# Patient Record
Sex: Male | Born: 1955 | Race: Black or African American | Hispanic: No | Marital: Married | Smoking: Former smoker
Health system: Southern US, Community
[De-identification: ages and names within clinical notes are randomized; demographics above are authoritative.]

## PROBLEM LIST (undated history)

## (undated) DIAGNOSIS — F439 Reaction to severe stress, unspecified: Secondary | ICD-10-CM

## (undated) DIAGNOSIS — E785 Hyperlipidemia, unspecified: Secondary | ICD-10-CM

## (undated) DIAGNOSIS — E559 Vitamin D deficiency, unspecified: Secondary | ICD-10-CM

## (undated) DIAGNOSIS — E669 Obesity, unspecified: Secondary | ICD-10-CM

## (undated) DIAGNOSIS — R319 Hematuria, unspecified: Secondary | ICD-10-CM

## (undated) DIAGNOSIS — M1990 Unspecified osteoarthritis, unspecified site: Secondary | ICD-10-CM

## (undated) DIAGNOSIS — K0530 Chronic periodontitis, unspecified: Secondary | ICD-10-CM

## (undated) DIAGNOSIS — I10 Essential (primary) hypertension: Secondary | ICD-10-CM

## (undated) DIAGNOSIS — N529 Male erectile dysfunction, unspecified: Secondary | ICD-10-CM

## (undated) HISTORY — DX: Reaction to severe stress, unspecified: F43.9

## (undated) HISTORY — DX: Chronic periodontitis, unspecified: K05.30

## (undated) HISTORY — DX: Vitamin D deficiency, unspecified: E55.9

## (undated) HISTORY — DX: Hyperlipidemia, unspecified: E78.5

## (undated) HISTORY — DX: Obesity, unspecified: E66.9

## (undated) HISTORY — PX: TONSILLECTOMY: SUR1361

## (undated) HISTORY — DX: Essential (primary) hypertension: I10

## (undated) HISTORY — DX: Male erectile dysfunction, unspecified: N52.9

---

## 2000-05-16 ENCOUNTER — Emergency Department (HOSPITAL_COMMUNITY): Admission: EM | Admit: 2000-05-16 | Discharge: 2000-05-16 | Payer: Self-pay | Admitting: Emergency Medicine

## 2000-08-03 ENCOUNTER — Encounter: Payer: Self-pay | Admitting: Emergency Medicine

## 2000-08-03 ENCOUNTER — Emergency Department (HOSPITAL_COMMUNITY): Admission: EM | Admit: 2000-08-03 | Discharge: 2000-08-03 | Payer: Self-pay | Admitting: Emergency Medicine

## 2002-10-10 ENCOUNTER — Encounter: Payer: Self-pay | Admitting: Internal Medicine

## 2002-10-10 ENCOUNTER — Encounter: Admission: RE | Admit: 2002-10-10 | Discharge: 2002-10-10 | Payer: Self-pay | Admitting: Internal Medicine

## 2005-01-13 ENCOUNTER — Encounter: Admission: RE | Admit: 2005-01-13 | Discharge: 2005-01-13 | Payer: Self-pay | Admitting: Internal Medicine

## 2005-12-17 ENCOUNTER — Ambulatory Visit (HOSPITAL_COMMUNITY): Admission: RE | Admit: 2005-12-17 | Discharge: 2005-12-17 | Payer: Self-pay | Admitting: Cardiology

## 2008-12-19 ENCOUNTER — Encounter: Admission: RE | Admit: 2008-12-19 | Discharge: 2008-12-19 | Payer: Self-pay | Admitting: Internal Medicine

## 2009-06-14 ENCOUNTER — Emergency Department (HOSPITAL_COMMUNITY): Admission: EM | Admit: 2009-06-14 | Discharge: 2009-06-14 | Payer: Self-pay | Admitting: Emergency Medicine

## 2012-11-18 ENCOUNTER — Encounter: Payer: Self-pay | Admitting: *Deleted

## 2012-11-18 DIAGNOSIS — I10 Essential (primary) hypertension: Secondary | ICD-10-CM | POA: Insufficient documentation

## 2012-11-18 DIAGNOSIS — F439 Reaction to severe stress, unspecified: Secondary | ICD-10-CM | POA: Insufficient documentation

## 2012-11-18 DIAGNOSIS — N529 Male erectile dysfunction, unspecified: Secondary | ICD-10-CM | POA: Insufficient documentation

## 2012-11-18 DIAGNOSIS — E559 Vitamin D deficiency, unspecified: Secondary | ICD-10-CM | POA: Insufficient documentation

## 2012-11-18 DIAGNOSIS — E669 Obesity, unspecified: Secondary | ICD-10-CM | POA: Insufficient documentation

## 2012-11-18 DIAGNOSIS — E785 Hyperlipidemia, unspecified: Secondary | ICD-10-CM | POA: Insufficient documentation

## 2017-04-14 ENCOUNTER — Ambulatory Visit: Payer: Self-pay

## 2017-04-14 ENCOUNTER — Other Ambulatory Visit: Payer: Self-pay | Admitting: Occupational Medicine

## 2017-04-14 DIAGNOSIS — M25562 Pain in left knee: Secondary | ICD-10-CM

## 2017-04-17 ENCOUNTER — Encounter (HOSPITAL_COMMUNITY): Payer: Self-pay | Admitting: Nurse Practitioner

## 2017-04-17 ENCOUNTER — Emergency Department (HOSPITAL_COMMUNITY)
Admission: EM | Admit: 2017-04-17 | Discharge: 2017-04-17 | Disposition: A | Discharge status: Home / Self Care | Payer: Self-pay | Attending: Emergency Medicine | Admitting: Emergency Medicine

## 2017-04-17 DIAGNOSIS — Z7982 Long term (current) use of aspirin: Secondary | ICD-10-CM | POA: Insufficient documentation

## 2017-04-17 DIAGNOSIS — Y999 Unspecified external cause status: Secondary | ICD-10-CM | POA: Insufficient documentation

## 2017-04-17 DIAGNOSIS — S8992XA Unspecified injury of left lower leg, initial encounter: Secondary | ICD-10-CM | POA: Insufficient documentation

## 2017-04-17 DIAGNOSIS — X509XXA Other and unspecified overexertion or strenuous movements or postures, initial encounter: Secondary | ICD-10-CM | POA: Insufficient documentation

## 2017-04-17 DIAGNOSIS — Z79899 Other long term (current) drug therapy: Secondary | ICD-10-CM | POA: Insufficient documentation

## 2017-04-17 DIAGNOSIS — I10 Essential (primary) hypertension: Secondary | ICD-10-CM | POA: Insufficient documentation

## 2017-04-17 DIAGNOSIS — Z87891 Personal history of nicotine dependence: Secondary | ICD-10-CM | POA: Insufficient documentation

## 2017-04-17 DIAGNOSIS — Y9301 Activity, walking, marching and hiking: Secondary | ICD-10-CM | POA: Insufficient documentation

## 2017-04-17 DIAGNOSIS — Y92018 Other place in single-family (private) house as the place of occurrence of the external cause: Secondary | ICD-10-CM | POA: Insufficient documentation

## 2017-04-17 MED ORDER — HYDROCODONE-ACETAMINOPHEN 5-325 MG PO TABS: 1.0000 | ORAL_TABLET | Freq: Four times a day (QID) | ORAL | 0 refills | Status: DC | PRN

## 2017-04-17 MED ORDER — IBUPROFEN 600 MG PO TABS: 600.0000 mg | ORAL_TABLET | Freq: Four times a day (QID) | ORAL | 0 refills | Status: DC | PRN

## 2017-04-17 MED ORDER — HYDROCODONE-ACETAMINOPHEN 5-325 MG PO TABS
1.0000 | ORAL_TABLET | Freq: Once | ORAL | Status: AC
  Administered 2017-04-17: 1 via ORAL
  Filled 2017-04-17: qty 1

## 2017-04-17 NOTE — ED Provider Notes (Signed)
[facility]-EMERGENCY DEPT Provider Note   CSN: [id number] Arrival date & time: 04/17/17  1914     History   Chief Complaint Chief Complaint  Patient presents with  . Knee Pain    Left    HPI Daniel Perez is a 61 y.o. male.  HPI   Daniel Perez is a 61 y.o. male, with a history of HTN, presenting to the ED with Left knee pain beginning 5 days ago. 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 Patient was seen at occupational health on June 12, x-rays were clear, was given a knee sleeve, and discharge with instructions to take ibuprofen or naproxen and follow-up in 2-3 weeks. Earlier today, patient stepped off his front porch, felt and heard a pop in the left knee, followed by 10 out of 10 pain. Pain is worse with weightbearing. He states he has not previously injured this knee before. He has taken Aleve without improvement. Denies neuro deficits, falls, or any other complaints.    Past Medical History:  Diagnosis Date  . Erectile dysfunction   . Hyperlipidemia   . Hypertension   . Mild obesity   . Periodontitis   . Situational stress    transient  . Vitamin D deficiency     Patient Active Problem List   Diagnosis Date Noted  . Hypertension   . Hyperlipidemia   . Mild obesity   . Erectile dysfunction   . Situational stress   . Vitamin D deficiency     Past Surgical History:  Procedure Laterality Date  . TONSILLECTOMY     1992       Home Medications    Prior to Admission medications   Medication Sig Start Date End Date Taking? Authorizing Provider  aspirin 81 MG tablet Take 81 mg by mouth daily.    [provider]  atorvastatin (LIPITOR) 20 MG tablet Take 20 mg by mouth daily.    [provider]  doxazosin (CARDURA) 4 MG tablet Take 4 mg by mouth at bedtime.    [provider]  HYDROcodone-acetaminophen (NORCO/VICODIN) 5-325 MG tablet Take 1-2 tablets by mouth every 6 (six) hours as  needed for severe pain. 04/17/17   Joy, Shawn C, PA-C  ibuprofen (ADVIL,MOTRIN) 600 MG tablet Take 1 tablet (600 mg total) by mouth every 6 (six) hours as needed. 04/17/17   Joy, Shawn C, PA-C  Multiple Vitamins-Minerals (MULTIVITAMIN PO) Take by mouth daily.    [provider]  triamterene-hydrochlorothiazide (DYAZIDE) 50-25 MG per capsule Take 1 capsule by mouth daily.    [provider]  VITAMIN D, CHOLECALCIFEROL, PO Take 1 tablet by mouth daily.    [provider]    Family History Family History  Problem Relation Age of Onset  . Stroke Mother   . Lung cancer Father   . Cirrhosis Sister   . Pneumonia Sister     Social History Social History  Substance Use Topics  . Smoking status: Former Games developer  . Smokeless tobacco: Never Used  . Alcohol use No     Allergies   Patient has no known allergies.   Review of Systems Review of Systems  Musculoskeletal: Positive for arthralgias and joint swelling.  Neurological: Negative for weakness and numbness.     Physical Exam Updated Vital Signs BP (!) 138/93 (BP Location: Left Arm)   Pulse 74   Temp 98.3 F (36.8 C) (Oral)   Resp 18  Ht 5\' 10"  (1.778 m)   Wt 95.8 kg (211 lb 3.2 oz)   SpO2 99%   BMI 30.30 kg/m   Physical Exam  Constitutional: He appears well-developed and well-nourished. No distress.  HENT:  Head: Normocephalic and atraumatic.  Eyes: Conjunctivae are normal.  Neck: Neck supple.  Cardiovascular: Normal rate, regular rhythm and intact distal pulses.   Pulmonary/Chest: Effort normal.  Musculoskeletal: He exhibits edema and tenderness.  Tenderness to the lateral left knee with some associated swelling. No noted deformity, erythema, or crepitus. Pain intensifies over the LCL with weightbearing, full extension, and varus stress. Range of motion intact. No noted laxity.  Neurological: He is alert.  No sensory deficits noted. Strength 5 out of 5 in the bilateral lower extremities.    Skin: Skin is warm and dry. He is not diaphoretic.  Psychiatric: He has a normal mood and affect. His behavior is normal.  Nursing note and vitals reviewed.    ED Treatments / Results  Labs (all labs ordered are listed, but only abnormal results are displayed) Labs Reviewed - No data to display  EKG  EKG Interpretation None       Radiology No results found.  Procedures Procedures (including critical care time)  Medications Ordered in ED Medications  HYDROcodone-acetaminophen (NORCO/VICODIN) 5-325 MG per tablet 1 tablet (1 tablet Oral Given 04/17/17 2134)     Initial Impression / Assessment and Plan / ED Course  I have reviewed the triage vital signs and the nursing notes.  Pertinent labs & imaging results that were available during my care of the patient were reviewed by me and considered in my medical decision making (see chart for details).      Patient presents with a left knee injury. Possible LCL injury without suspected complete tear. Crutches and knee immobilizer. Orthopedic follow-up.     Final Clinical Impressions(s) / ED Diagnoses   Final diagnoses:  Injury of left knee, initial encounter    New Prescriptions Discharge Medication List as of 04/17/2017  9:34 PM    START taking these medications   Details  HYDROcodone-acetaminophen (NORCO/VICODIN) 5-325 MG tablet Take 1-2 tablets by mouth every 6 (six) hours as needed for severe pain., Starting Fri 04/17/2017, Print    ibuprofen (ADVIL,MOTRIN) 600 MG tablet Take 1 tablet (600 mg total) by mouth every 6 (six) hours as needed., Starting Fri 04/17/2017, Print         Joy, ClintShawn C, PA-C 04/18/17 0112    Raeford RazorKohut, Stephen, MD 04/20/17 [id number]

## 2017-04-17 NOTE — Discharge Instructions (Signed)
You have been seen today for a knee injury. A partial ligament tear is possible. This could be from your injury sustained earlier in the week. Pain: Take 600 mg of ibuprofen every 6 hours or 440 mg (over the counter dose) to 500 mg (prescription dose) of naproxen every 12 hours or for the next 3 days. After this time, these medications may be used as needed for pain. Take these medications with food to avoid upset stomach. Choose only one of these medications, do not take them together.  Vicodin for severe pain. Do not drive or perform other dangerous activities while taking the Vicodin. Ice: May apply ice to the area over the next 24 hours for 15 minutes at a time to reduce swelling. Elevation: Keep the extremity elevated as often as possible to reduce pain and inflammation. Support: Wear the knee immmobilizer for support and comfort. Wear this until pain resolves.  Follow up: Follow up with orthopedics on this issue. Call the number provided to set up an appointment.

## 2017-04-17 NOTE — ED Triage Notes (Signed)
Pt c/o left knee pain stating she heard a pop knee sound accompanied the severe pain. Notes mild swelling.

## 2017-04-17 NOTE — ED Notes (Signed)
Pt had [facility] to [facility] and got an x-ray performed, and was given a brace and told to take aleve. Pt went down off a step today and heard a "pop." Pt c/o 8/10 pain when bearing weight. Pt has extension and flexion without pain. Ambulatory.

## 2018-07-15 ENCOUNTER — Inpatient Hospital Stay (HOSPITAL_COMMUNITY)
Admission: EM | Admit: 2018-07-15 | Discharge: 2018-07-17 | DRG: 872 | Disposition: A | Discharge status: Home / Self Care | Payer: 59 | Attending: Family Medicine | Admitting: Family Medicine

## 2018-07-15 ENCOUNTER — Emergency Department (HOSPITAL_COMMUNITY): Payer: 59

## 2018-07-15 ENCOUNTER — Encounter (HOSPITAL_COMMUNITY): Payer: Self-pay

## 2018-07-15 ENCOUNTER — Other Ambulatory Visit: Payer: Self-pay

## 2018-07-15 DIAGNOSIS — N400 Enlarged prostate without lower urinary tract symptoms: Secondary | ICD-10-CM | POA: Diagnosis present

## 2018-07-15 DIAGNOSIS — E559 Vitamin D deficiency, unspecified: Secondary | ICD-10-CM | POA: Diagnosis present

## 2018-07-15 DIAGNOSIS — N3001 Acute cystitis with hematuria: Secondary | ICD-10-CM | POA: Diagnosis present

## 2018-07-15 DIAGNOSIS — Z87891 Personal history of nicotine dependence: Secondary | ICD-10-CM

## 2018-07-15 DIAGNOSIS — Z7982 Long term (current) use of aspirin: Secondary | ICD-10-CM | POA: Diagnosis not present

## 2018-07-15 DIAGNOSIS — E669 Obesity, unspecified: Secondary | ICD-10-CM | POA: Diagnosis present

## 2018-07-15 DIAGNOSIS — Z6833 Body mass index (BMI) 33.0-33.9, adult: Secondary | ICD-10-CM | POA: Diagnosis not present

## 2018-07-15 DIAGNOSIS — Z801 Family history of malignant neoplasm of trachea, bronchus and lung: Secondary | ICD-10-CM | POA: Diagnosis not present

## 2018-07-15 DIAGNOSIS — E785 Hyperlipidemia, unspecified: Secondary | ICD-10-CM | POA: Diagnosis present

## 2018-07-15 DIAGNOSIS — R972 Elevated prostate specific antigen [PSA]: Secondary | ICD-10-CM | POA: Diagnosis present

## 2018-07-15 DIAGNOSIS — B9620 Unspecified Escherichia coli [E. coli] as the cause of diseases classified elsewhere: Secondary | ICD-10-CM | POA: Diagnosis present

## 2018-07-15 DIAGNOSIS — N529 Male erectile dysfunction, unspecified: Secondary | ICD-10-CM | POA: Diagnosis present

## 2018-07-15 DIAGNOSIS — A4151 Sepsis due to Escherichia coli [E. coli]: Secondary | ICD-10-CM | POA: Diagnosis not present

## 2018-07-15 DIAGNOSIS — A419 Sepsis, unspecified organism: Secondary | ICD-10-CM | POA: Diagnosis present

## 2018-07-15 DIAGNOSIS — N179 Acute kidney failure, unspecified: Secondary | ICD-10-CM | POA: Diagnosis present

## 2018-07-15 DIAGNOSIS — I10 Essential (primary) hypertension: Secondary | ICD-10-CM

## 2018-07-15 DIAGNOSIS — Z79899 Other long term (current) drug therapy: Secondary | ICD-10-CM

## 2018-07-15 DIAGNOSIS — R319 Hematuria, unspecified: Secondary | ICD-10-CM

## 2018-07-15 HISTORY — DX: Hematuria, unspecified: R31.9

## 2018-07-15 HISTORY — DX: Unspecified osteoarthritis, unspecified site: M19.90

## 2018-07-15 LAB — URINALYSIS, ROUTINE W REFLEX MICROSCOPIC
BILIRUBIN URINE: NEGATIVE
Glucose, UA: NEGATIVE mg/dL
KETONES UR: 5 mg/dL — AB
Nitrite: NEGATIVE
PROTEIN: 30 mg/dL — AB
Specific Gravity, Urine: 1.012 (ref 1.005–1.030)
pH: 6 (ref 5.0–8.0)

## 2018-07-15 LAB — BASIC METABOLIC PANEL
Anion gap: 11 (ref 5–15)
BUN: 10 mg/dL (ref 8–23)
CO2: 27 mmol/L (ref 22–32)
CREATININE: 1.38 mg/dL — AB (ref 0.61–1.24)
Calcium: 9.4 mg/dL (ref 8.9–10.3)
Chloride: 98 mmol/L (ref 98–111)
GFR calc Af Amer: >60 mL/min (ref >60)
GFR, EST NON AFRICAN AMERICAN: 54 mL/min — AB (ref >60)
Glucose, Bld: 112 mg/dL — ABNORMAL HIGH (ref 70–99)
Potassium: 3.5 mmol/L (ref 3.5–5.1)
SODIUM: 136 mmol/L (ref 135–145)

## 2018-07-15 LAB — HEPATIC FUNCTION PANEL
ALBUMIN: 3.8 g/dL (ref 3.5–5.0)
ALK PHOS: 58 U/L (ref 38–126)
ALT: 28 U/L (ref 0–44)
AST: 24 U/L (ref 15–41)
BILIRUBIN DIRECT: 0.3 mg/dL — AB (ref 0.0–0.2)
Indirect Bilirubin: 1.2 mg/dL — ABNORMAL HIGH (ref 0.3–0.9)
Total Bilirubin: 1.5 mg/dL — ABNORMAL HIGH (ref 0.3–1.2)
Total Protein: 8.1 g/dL (ref 6.5–8.1)

## 2018-07-15 LAB — CBC
HCT: 45.9 % (ref 39.0–52.0)
Hemoglobin: 15.1 g/dL (ref 13.0–17.0)
MCH: 29.3 pg (ref 26.0–34.0)
MCHC: 32.9 g/dL (ref 30.0–36.0)
MCV: 89.1 fL (ref 78.0–100.0)
PLATELETS: 207 10*3/uL (ref 150–400)
RBC: 5.15 MIL/uL (ref 4.22–5.81)
RDW: 13.7 % (ref 11.5–15.5)
WBC: 16.9 10*3/uL — AB (ref 4.0–10.5)

## 2018-07-15 LAB — I-STAT CG4 LACTIC ACID, ED: Lactic Acid, Venous: 1.29 mmol/L (ref 0.5–1.9)

## 2018-07-15 MED ORDER — SODIUM CHLORIDE 0.9 % IV SOLN
1.0000 g | INTRAVENOUS | Status: DC
  Administered 2018-07-15 – 2018-07-17 (×3): 1 g via INTRAVENOUS
  Filled 2018-07-15 (×3): qty 10

## 2018-07-15 MED ORDER — ATORVASTATIN CALCIUM 20 MG PO TABS
20.0000 mg | ORAL_TABLET | Freq: Every day | ORAL | Status: DC
  Administered 2018-07-16: 20 mg via ORAL
  Filled 2018-07-15 (×2): qty 1

## 2018-07-15 MED ORDER — ONDANSETRON HCL 4 MG PO TABS: 4.0000 mg | ORAL_TABLET | Freq: Four times a day (QID) | ORAL | Status: DC | PRN

## 2018-07-15 MED ORDER — DOXAZOSIN MESYLATE 2 MG PO TABS
4.0000 mg | ORAL_TABLET | Freq: Every day | ORAL | Status: DC
  Administered 2018-07-16: 4 mg via ORAL
  Filled 2018-07-15: qty 2

## 2018-07-15 MED ORDER — ENOXAPARIN SODIUM 40 MG/0.4ML ~~LOC~~ SOLN
40.0000 mg | SUBCUTANEOUS | Status: DC
  Administered 2018-07-15 – 2018-07-16 (×2): 40 mg via SUBCUTANEOUS
  Filled 2018-07-15 (×2): qty 0.4

## 2018-07-15 MED ORDER — ACETAMINOPHEN 325 MG PO TABS
650.0000 mg | ORAL_TABLET | Freq: Four times a day (QID) | ORAL | Status: DC | PRN
  Administered 2018-07-15 – 2018-07-16 (×2): 650 mg via ORAL
  Filled 2018-07-15 (×2): qty 2

## 2018-07-15 MED ORDER — POLYETHYLENE GLYCOL 3350 17 G PO PACK: 17.0000 g | PACK | Freq: Every day | ORAL | Status: DC | PRN

## 2018-07-15 MED ORDER — ACETAMINOPHEN 650 MG RE SUPP: 650.0000 mg | Freq: Four times a day (QID) | RECTAL | Status: DC | PRN

## 2018-07-15 MED ORDER — ACETAMINOPHEN 500 MG PO TABS
1000.0000 mg | ORAL_TABLET | Freq: Once | ORAL | Status: AC
  Administered 2018-07-15: 1000 mg via ORAL
  Filled 2018-07-15: qty 2

## 2018-07-15 MED ORDER — ONDANSETRON HCL 4 MG/2ML IJ SOLN: 4.0000 mg | Freq: Four times a day (QID) | INTRAMUSCULAR | Status: DC | PRN

## 2018-07-15 MED ORDER — SODIUM CHLORIDE 0.9 % IV BOLUS
1000.0000 mL | Freq: Once | INTRAVENOUS | Status: AC
  Administered 2018-07-15: 1000 mL via INTRAVENOUS

## 2018-07-15 NOTE — H&P (Signed)
Family Medicine Teaching [facility] Admission History and Physical Service Pager: [phone]  Patient name: Daniel Perez Medical record number: [id number] Date of birth: January 06, 1956 Age: 62 y.o. Gender: male  Primary Care Provider: Gwenyth Bender, MD Consultants: none Code Status: full  Chief Complaint: hematuria  Assessment and Plan: Daniel Perez is a 62 y.o. male presenting with hematuria . PMH is significant for HTN, HLD, obesity  Urosepsis.  Patient presented with urinary frequency, urgency, dysuria and hematuria.  He meets sepsis criteria 2 out of 4 SIRS with temperature of 101.0 Fahrenheit and leukocytosis with WBC 16.9. qSOFA 0. His UA is showing large hemoglobin and leukocytes.  He has L CVA tenderness on exam concerning for pyelonephritis.  CT renal study is reassuring with no stones or hydronephrosis.  Does not have a lactic acidosis.  Suspect possible mild AKI given creatinine of 1.38 however no baseline creatinine available.  - Admit to MedSurg, attending Dr. Manson Passey - continue IV ceftriaxone (9/12-) started in the ED - Monitor vitals - Follow-up on blood and urine cultures - Monitor creatinine  Hypertension.  Blood pressure stable on admission at 126/90 -Consider restart home triamterene-HCTZ tomorrow morning based on SCr -Monitor vitals  Hyperlipidemia -Continue home atorvastatin -Check lipid panel  Prostate issues. Likely BPH as patient is on alpha blocker "for prevention of prostate issues" by PCP per patient report - continue home doxazosin  FEN/GI: Heart healthy diet Prophylaxis: Lovenox  Disposition: Admit to MedSurg  History of Present Illness:  Daniel Perez is a 62 y.o. male presenting with hematuria.  Patient states that he has been feeling tired and achy all over with chills for the past couple of days.  Last night he started having urinary frequency and urgency and he was up all night.  This morning he tried to go to work but then noticed blood in his  urine and went to his PCP.  He was told to come to the emergency room.  He also developed dysuria later this morning.  He states that he had no abdominal pain and only noticed that he had left-sided flank pain that he rates as mild that started this afternoon.  He has no nausea or vomiting.  No diarrhea.  No abdominal pain.  No history of kidney stones or UTI in the past.  Review Of Systems: Per HPI with the following additions:   Review of Systems  Constitutional: Positive for chills, fever and malaise/fatigue. Negative for diaphoresis.  HENT: Negative for congestion and sore throat.   Respiratory: Negative for shortness of breath and wheezing.   Cardiovascular: Negative for chest pain, palpitations and leg swelling.  Gastrointestinal: Negative for abdominal pain, blood in stool, constipation, diarrhea, nausea and vomiting.  Genitourinary: Positive for dysuria, flank pain (L sided), frequency, hematuria and urgency.  Skin: Negative for rash.  Neurological: Negative for dizziness, weakness and headaches.    Patient Active Problem List   Diagnosis Date Noted  . Hypertension   . Hyperlipidemia   . Mild obesity   . Erectile dysfunction   . Situational stress   . Vitamin D deficiency     Past Medical History: Past Medical History:  Diagnosis Date  . Erectile dysfunction   . Hyperlipidemia   . Hypertension   . Mild obesity   . Periodontitis   . Situational stress    transient  . Vitamin D deficiency     Past Surgical History: Past Surgical History:  Procedure Laterality Date  . TONSILLECTOMY     1992  Social History: Social History   Tobacco Use  . Smoking status: Former Games developermoker  . Smokeless tobacco: Never Used  Substance Use Topics  . Alcohol use: No  . Drug use: No   Additional social history: Quit 40 years ago. Age [age] years old, 1 ppd.   Please also refer to relevant sections of EMR.  Family History: Family History  Problem Relation Age of Onset  .  Stroke Mother   . Lung cancer Father   . Cirrhosis Sister   . Pneumonia Sister     Allergies and Medications: No Known Allergies No current facility-administered medications on file prior to encounter.    Current Outpatient Medications on File Prior to Encounter  Medication Sig Dispense Refill  . atorvastatin (LIPITOR) 20 MG tablet Take 20 mg by mouth daily.    Marland Kitchen. doxazosin (CARDURA) 4 MG tablet Take 4 mg by mouth daily.     Marland Kitchen. ibuprofen (ADVIL,MOTRIN) 200 MG tablet Take 600-800 mg by mouth every 6 (six) hours as needed for fever, headache or moderate pain.    Marland Kitchen. omega-3 acid ethyl esters (LOVAZA) 1 g capsule Take 1 g by mouth daily.    . tadalafil (ADCIRCA/CIALIS) 20 MG tablet Take 20 mg by mouth daily as needed for erectile dysfunction.   3  . triamterene-hydrochlorothiazide (MAXZIDE-25) 37.5-25 MG tablet Take 1 tablet by mouth daily.    Marland Kitchen. HYDROcodone-acetaminophen (NORCO/VICODIN) 5-325 MG tablet Take 1-2 tablets by mouth every 6 (six) hours as needed for severe pain. (Patient not taking: Reported on 07/15/2018) 15 tablet 0  . ibuprofen (ADVIL,MOTRIN) 600 MG tablet Take 1 tablet (600 mg total) by mouth every 6 (six) hours as needed. (Patient not taking: Reported on 07/15/2018) 30 tablet 0    Objective: BP 130/86   Pulse 85   Temp 98.7 F (37.1 C) (Oral)   Resp 18   Ht 5' 8.5" (1.74 m)   Wt 100.2 kg   SpO2 96%   BMI 33.11 kg/m  Exam: General: Sitting up in bed comfortably, well appearing, in no acute distress Eyes: Pupils round and reactive to light bilaterally, extraocular movements intact ENTM: Moist mucous membranes, tongue midline Neck: Supple, no lymphadenopathy Cardiovascular: Regular rate and rhythm, no murmurs Respiratory: Clear to auscultation bilaterally, normal work of breathing on room air Gastrointestinal: Soft, nondistended, positive bowel sounds, nontender.  Positive left-sided CVA tenderness MSK: Normal bulk and tone Derm: Warm and dry, no rashes Neuro: Alert  and oriented, grossly normal without focal deficits Psych: Appropriate affect  Labs and Imaging: CBC BMET  Recent Labs  Lab 07/15/18 1204  WBC 16.9*  HGB 15.1  HCT 45.9  PLT 207   Recent Labs  Lab 07/15/18 1204  NA 136  K 3.5  CL 98  CO2 27  BUN 10  CREATININE 1.38*  GLUCOSE 112*  CALCIUM 9.4     Lactic Acid, Venous    Component Value Date/Time   LATICACIDVEN 1.29 07/15/2018 1532    Urinalysis    Component Value Date/Time   COLORURINE YELLOW 07/15/2018 1147   APPEARANCEUR HAZY (A) 07/15/2018 1147   LABSPEC 1.012 07/15/2018 1147   PHURINE 6.0 07/15/2018 1147   GLUCOSEU NEGATIVE 07/15/2018 1147   HGBUR LARGE (A) 07/15/2018 1147   BILIRUBINUR NEGATIVE 07/15/2018 1147   KETONESUR 5 (A) 07/15/2018 1147   PROTEINUR 30 (A) 07/15/2018 1147   NITRITE NEGATIVE 07/15/2018 1147   LEUKOCYTESUR LARGE (A) 07/15/2018 1147     Ct Renal Stone Study  Result Date: 07/15/2018 CLINICAL  DATA:  Left flank pain with hematuria EXAM: CT ABDOMEN AND PELVIS WITHOUT CONTRAST TECHNIQUE: Multidetector CT imaging of the abdomen and pelvis was performed following the standard protocol without IV contrast. COMPARISON:  None. FINDINGS: Lower chest: Lung bases demonstrate no acute consolidation or effusion. The heart size is normal. Hepatobiliary: Small hypodensities in the liver, likely cysts. No calcified stones or biliary dilatation Pancreas: Unremarkable. No pancreatic ductal dilatation or surrounding inflammatory changes. Spleen: Normal in size without focal abnormality. Adrenals/Urinary Tract: Adrenal glands are within normal limits. Nonspecific perinephric fat stranding. No hydronephrosis or ureteral stone. Vague polypoid density at the posterior bladder Stomach/Bowel: Stomach is within normal limits. Appendix appears normal. No evidence of bowel wall thickening, distention, or inflammatory changes. Vascular/Lymphatic: Mild aortic atherosclerosis. No aneurysm. No significantly enlarged lymph  nodes. Reproductive: Slightly enlarged prostate Other: Negative for free air or free fluid. Fat in the umbilical region Musculoskeletal: Degenerative changes at L5-S1 IMPRESSION: 1. Negative for hydronephrosis or ureteral stone 2. Vague polypoid density at the posterior bladder, either representing a bladder mass/hematoma or mass effect from enlarged prostate gland. Consider correlation with direct visualization. Electronically Signed   By: Jasmine Pang M.D.   On: 07/15/2018 17:50     Leland Her, DO 07/15/2018, 6:24 PM PGY-3, [facility] FPTS Intern pager: [phone], text pages welcome

## 2018-07-15 NOTE — ED Triage Notes (Signed)
Pt presents with onset of hematuria and L flank pain that began this morning.  Pt reports "a little" pain when he voids, reports feeling very fatigued yesterday.

## 2018-07-15 NOTE — ED Notes (Signed)
Gave pt turkey sandwich and water 

## 2018-07-15 NOTE — ED Provider Notes (Signed)
[facility]  Refer to provider note for full history and physical.  Briefly, patient is a 62 year old male with history of hypertension, hyperlipidemia, obesity presenting for evaluation of left-sided flank pain with associated urinary symptoms and fever.  Febrile with leukocytosis and elevated creatinine in the ED.  UA suggestive of UTI versus nephrolithiasis.  Code sepsis initiated.  Awaiting CT renal stone study to rule out nephrolithiasis.  Anticipate admission for possible urosepsis. Physical Exam  BP 112/78   Pulse 81   Temp (!) 101 F (38.3 C) (Oral)   Resp 15   Ht 5' 8.5" (1.74 m)   Wt 100.2 kg   SpO2 99%   BMI 33.11 kg/m   Physical Exam  Constitutional: He appears well-developed and well-nourished. No distress.  HENT:  Head: Normocephalic and atraumatic.  Eyes: Conjunctivae are normal. Right eye exhibits no discharge. Left eye exhibits no discharge.  Neck: No JVD present. No tracheal deviation present.  Cardiovascular: Normal rate, regular rhythm and normal heart sounds.  Pulmonary/Chest: Effort normal and breath sounds normal.  Abdominal: Soft. Bowel sounds are normal. He exhibits no distension. There is no tenderness. There is no guarding.  No CVA tenderness  Musculoskeletal: He exhibits no edema.  Neurological: He is alert.  Skin: Skin is warm and dry. No erythema.  Psychiatric: He has a normal mood and affect. His behavior is normal.  Nursing note and vitals reviewed.   ED Course/Procedures   Clinical Course as of Jul 16 1923  Thu Jul 15, 2018  1629 [facility]ely Bayliss PA-C   [EH]    Clinical Course User Index [EH] Cristina GongHammond, Elizabeth W, PA-C    Procedures  MDM  IMPRESSION: 1. Negative for hydronephrosis or ureteral stone 2. Vague polypoid density at the posterior bladder, either representing a bladder mass/hematoma or mass effect from enlarged prostate gland. Consider correlation with direct  visualization.  Spoke with Dr. Artist PaisYoo with family medicine teaching service who agrees to assume care of patient and bring him into the hospital for further evaluation and management.       Bennye AlmFawze, Paddy Walthall A, PA-C 07/15/18 1925    Benjiman CorePickering, Nathan, MD 07/16/18 [phone]

## 2018-07-15 NOTE — ED Provider Notes (Signed)
[facility]GENCY [facility] Provider Note   CSN: [id number] Arrival date & time: 07/15/18  1125     History   Chief Complaint Chief Complaint  Patient presents with  . Flank Pain    HPI Daniel Perez is a 62 y.o. male with a past medical history of hypertension, hyperlipidemia, who presents today for evaluation of left-sided flank pain.  He reports that since yesterday he has not been feeling well and feeling very tired.  Overnight he was up multiple times urinating frequently, denies dysuria.  When he was at work today he noticed blood in his urine, with left-sided flank pain, called his PCP and was directed to the ER.  Any history of kidney stones or bladder infections.  No anterior abdominal pain, no nausea or vomiting or diarrhea.  Reports poor appetite.  HPI  Past Medical History:  Diagnosis Date  . Erectile dysfunction   . Hyperlipidemia   . Hypertension   . Mild obesity   . Periodontitis   . Situational stress    transient  . Vitamin D deficiency     Patient Active Problem List   Diagnosis Date Noted  . Hypertension   . Hyperlipidemia   . Mild obesity   . Erectile dysfunction   . Situational stress   . Vitamin D deficiency     Past Surgical History:  Procedure Laterality Date  . TONSILLECTOMY     1992        Home Medications    Prior to Admission medications   Medication Sig Start Date End Date Taking? Authorizing Provider  aspirin 81 MG tablet Take 81 mg by mouth daily.    [provider]  atorvastatin (LIPITOR) 20 MG tablet Take 20 mg by mouth daily.    [provider]  doxazosin (CARDURA) 4 MG tablet Take 4 mg by mouth at bedtime.    [provider]  HYDROcodone-acetaminophen (NORCO/VICODIN) 5-325 MG tablet Take 1-2 tablets by mouth every 6 (six) hours as needed for severe pain. 04/17/17   Joy, Shawn C, PA-C  ibuprofen (ADVIL,MOTRIN) 600 MG tablet Take 1 tablet (600 mg total) by mouth every 6 (six)  hours as needed. 04/17/17   Joy, Shawn C, PA-C  Multiple Vitamins-Minerals (MULTIVITAMIN PO) Take by mouth daily.    [provider]  triamterene-hydrochlorothiazide (DYAZIDE) 50-25 MG per capsule Take 1 capsule by mouth daily.    [provider]  VITAMIN D, CHOLECALCIFEROL, PO Take 1 tablet by mouth daily.    [provider]    Family History Family History  Problem Relation Age of Onset  . Stroke Mother   . Lung cancer Father   . Cirrhosis Sister   . Pneumonia Sister     Social History Social History   Tobacco Use  . Smoking status: Former Games developermoker  . Smokeless tobacco: Never Used  Substance Use Topics  . Alcohol use: No  . Drug use: No     Allergies   Patient has no known allergies.   Review of Systems Review of Systems  Constitutional: Positive for fatigue and fever. Negative for chills.  HENT: Negative for ear pain and sore throat.   Eyes: Negative for pain and visual disturbance.  Respiratory: Negative for cough and shortness of breath.   Cardiovascular: Negative for chest pain and palpitations.  Gastrointestinal: Negative for abdominal pain and vomiting.  Genitourinary: Positive for dysuria, flank pain, hematuria and urgency.  Musculoskeletal: Negative for arthralgias and back pain.  Skin: Negative  for color change and rash.  Neurological: Negative for seizures and syncope.  All other systems reviewed and are negative.    Physical Exam Updated Vital Signs BP 112/78   Pulse 81   Temp (!) 101 F (38.3 C) (Oral)   Resp 15   Ht 5' 8.5" (1.74 m)   Wt 100.2 kg   SpO2 99%   BMI 33.11 kg/m   Physical Exam  Constitutional: He appears well-developed and well-nourished.  HENT:  Head: Normocephalic and atraumatic.  Mouth/Throat: Oropharynx is clear and moist.  Eyes: Conjunctivae are normal.  Neck: Neck supple.  Cardiovascular: Normal rate, regular rhythm and intact distal pulses.  No murmur heard. Pulmonary/Chest: Effort normal  and breath sounds normal. No respiratory distress.  Abdominal: Soft. Bowel sounds are normal. He exhibits no distension. There is no tenderness. There is no guarding.  Mild R sided CVA TTpercussion  Musculoskeletal: He exhibits no edema.  Neurological: He is alert.  Skin: Skin is warm and dry. He is not diaphoretic.  Psychiatric: He has a normal mood and affect.  Nursing note and vitals reviewed.    ED Treatments / Results  Labs (all labs ordered are listed, but only abnormal results are displayed) Labs Reviewed  URINALYSIS, ROUTINE W REFLEX MICROSCOPIC - Abnormal; Notable for the following components:      Result Value   APPearance HAZY (*)    Hgb urine dipstick LARGE (*)    Ketones, ur 5 (*)    Protein, ur 30 (*)    Leukocytes, UA LARGE (*)    Bacteria, UA FEW (*)    All other components within normal limits  BASIC METABOLIC PANEL - Abnormal; Notable for the following components:   Glucose, Bld 112 (*)    Creatinine, Ser 1.38 (*)    GFR calc non Af Amer 54 (*)    All other components within normal limits  CBC - Abnormal; Notable for the following components:   WBC 16.9 (*)    All other components within normal limits  HEPATIC FUNCTION PANEL - Abnormal; Notable for the following components:   Total Bilirubin 1.5 (*)    Bilirubin, Direct 0.3 (*)    Indirect Bilirubin 1.2 (*)    All other components within normal limits  CULTURE, BLOOD (ROUTINE X 2)  CULTURE, BLOOD (ROUTINE X 2)  URINE CULTURE  I-STAT CG4 LACTIC ACID, ED  I-STAT CG4 LACTIC ACID, ED    EKG None  Radiology No results found.  Procedures Procedures (including critical care time)  Medications Ordered in ED Medications  cefTRIAXone (ROCEPHIN) 1 g in sodium chloride 0.9 % 100 mL IVPB (0 g Intravenous Stopped 07/15/18 1633)  sodium chloride 0.9 % bolus 1,000 mL (0 mLs Intravenous Stopped 07/15/18 1633)  acetaminophen (TYLENOL) tablet 1,000 mg (1,000 mg Oral Given 07/15/18 1553)     Initial Impression  / Assessment and Plan / ED Course  I have reviewed the triage vital signs and the nursing notes.  Pertinent labs & imaging results that were available during my care of the patient were reviewed by me and considered in my medical decision making (see chart for details).  Clinical Course as of Jul 15 1641  Thu Jul 15, 2018  1629 Patient care transferred to [facility] PA-C   [EH]    Clinical Course User Index [EH] Cristina Gong, PA-C    Daniel Perez presents today for evaluation of left-sided flank pain.  Here he is found to meet Sirs criteria with a temp  of 101, and white count elevated at 19.6.  He has a suspected source of infection in his urine with large blood, 30 ketones, few bacteria and large leukocytes.  With sepsis was called, he was not hypotensive, lactic acid was not elevated above 4, therefore he was not given 30/kg fluid bolus.  He was given Rocephin.  Blood and urine cultures were both ordered.    He does not have a history of Urinary tract infection or frequent infections in the past.  Slight right sided CVA TTP.  Concern for pyelonephritis vs renal stone vs UTI.  CT renal study ordered.    At shift change care was transferred to [facility] PA-C who will follow pending studies, re-evaulate and determine disposition.     Final Clinical Impressions(s) / ED Diagnoses   Final diagnoses:  Sepsis, due to unspecified organism [facility])  Acute cystitis with hematuria    ED Discharge Orders    None       Norman Clay 07/15/18 1642    Arby Barrette, MD 07/20/18 [phone]

## 2018-07-16 LAB — CBC
HEMATOCRIT: 42.1 % (ref 39.0–52.0)
Hemoglobin: 13.6 g/dL (ref 13.0–17.0)
MCH: 28.5 pg (ref 26.0–34.0)
MCHC: 32.3 g/dL (ref 30.0–36.0)
MCV: 88.3 fL (ref 78.0–100.0)
Platelets: 197 10*3/uL (ref 150–400)
RBC: 4.77 MIL/uL (ref 4.22–5.81)
RDW: 13.9 % (ref 11.5–15.5)
WBC: 15.2 10*3/uL — AB (ref 4.0–10.5)

## 2018-07-16 LAB — LIPID PANEL
CHOLESTEROL: 116 mg/dL (ref 0–200)
HDL: 45 mg/dL (ref >40)
LDL CALC: 60 mg/dL (ref 0–99)
TRIGLYCERIDES: 57 mg/dL (ref <150)
Total CHOL/HDL Ratio: 2.6 RATIO
VLDL: 11 mg/dL (ref 0–40)

## 2018-07-16 LAB — BASIC METABOLIC PANEL
Anion gap: 9 (ref 5–15)
BUN: 10 mg/dL (ref 8–23)
CALCIUM: 8.6 mg/dL — AB (ref 8.9–10.3)
CO2: 27 mmol/L (ref 22–32)
Chloride: 101 mmol/L (ref 98–111)
Creatinine, Ser: 1.21 mg/dL (ref 0.61–1.24)
GFR calc Af Amer: >60 mL/min (ref >60)
GLUCOSE: 108 mg/dL — AB (ref 70–99)
POTASSIUM: 3.1 mmol/L — AB (ref 3.5–5.1)
SODIUM: 137 mmol/L (ref 135–145)

## 2018-07-16 LAB — PSA: Prostatic Specific Antigen: 10.57 ng/mL — ABNORMAL HIGH (ref 0.00–4.00)

## 2018-07-16 LAB — HIV ANTIBODY (ROUTINE TESTING W REFLEX): HIV SCREEN 4TH GENERATION: NONREACTIVE

## 2018-07-16 MED ORDER — TAMSULOSIN HCL 0.4 MG PO CAPS
0.4000 mg | ORAL_CAPSULE | Freq: Every day | ORAL | Status: DC
  Administered 2018-07-17: 0.4 mg via ORAL
  Filled 2018-07-16: qty 1

## 2018-07-16 MED ORDER — POTASSIUM CHLORIDE CRYS ER 20 MEQ PO TBCR
40.0000 meq | EXTENDED_RELEASE_TABLET | Freq: Once | ORAL | Status: AC
  Administered 2018-07-16: 40 meq via ORAL
  Filled 2018-07-16: qty 2

## 2018-07-16 NOTE — Progress Notes (Signed)
PSA drawn.

## 2018-07-16 NOTE — Progress Notes (Signed)
Post void residual: 60ml

## 2018-07-16 NOTE — Progress Notes (Signed)
Called lab asked if could come draw PSA for results for discharge.  Lab stated they were in middle of shift change and would be up as soon as possible.

## 2018-07-16 NOTE — Progress Notes (Signed)
Family Medicine Teaching Service Daily Progress Note Intern Pager: [phone]  Patient name: Daniel Perez Medical record number: [id number] Date of birth: Mar 23, 1956 Age: 62 y.o. Gender: male  Primary Care Provider: Gwenyth Benderean, Eric L, MD Consultants: none Code Status: full  Chief Complaint: hematuria  Assessment and Plan: Daniel Bustlemile Sabado is a 62 y.o. male presenting with hematuria . PMH is significant for HTN, HLD, obesity  Urosepsis with BPH, improving : patient afebrile, WBC 16.9 > 15.2. Enlarged prostate contributing.  - continue IV ceftriaxone (9/12-). Switch to PO meds on 07/17/2018 and dc home if stable.  - Monitor vitals - Follow-up on blood and urine cultures - Creatinine 1.38 > 1.21 on 9/13 - PSA 9/13 - follow up  Hypertension, stable: BP 124/87 on 9/13. -Continue to hold home triamterene-HCTZ based on SCr -Monitor vitals  Hyperlipidemia, stable: lipid panel in healthy range  -Continue home atorvastatin  Prostate issues. Likely BPH as patient is on alpha blocker "for prevention of prostate issues" by PCP per patient report - changed doxazosin to Tamsulosin  FEN/GI: Heart healthy diet Prophylaxis: Lovenox  Disposition: Admit to MedSurg  Subjective:  Patient seen this morning resting in bed with wife TiptonBeverly at bedside. He states he has a headache and occasional chills but his fatigue and achiness have improved. He denies dysuria this morning but reports having difficulty initiating stream. He reports the flank pain has improved but his back pain/CVA tenderness has resolved. He denies chest pain, dyspnea, fever, nausea, vomiting, diarrhea, abdominal pain, and dysuria.  Objective: Temp:  [98.7 F (37.1 C)-101 F (38.3 C)] 99.1 F (37.3 C) (09/13 0841) Pulse Rate:  [72-101] 72 (09/13 0841) Resp:  [13-20] 17 (09/13 0841) BP: (108-130)/(67-90) 124/87 (09/13 0841) SpO2:  [94 %-99 %] 99 % (09/13 0841) Weight:  [100.2 kg] 100.2 kg (09/12 1144)  Physical Exam   Constitutional: He is oriented to person, place, and time. He appears well-developed and well-nourished. No distress.  HENT:  Head: Normocephalic and atraumatic.  Eyes: EOM are normal.  Neck: Normal range of motion.  Cardiovascular: Normal rate, regular rhythm and normal heart sounds.  Pulmonary/Chest: Effort normal and breath sounds normal. No respiratory distress.  Abdominal: Soft. Bowel sounds are normal. There is tenderness (to palpation bilateral flanks, improved).  Genitourinary:  Genitourinary Comments: Prostate boggy, enlarged  Musculoskeletal: Normal range of motion. He exhibits no edema.  Neurological: He is alert and oriented to person, place, and time.  Skin: Skin is warm. Capillary refill takes less than 2 seconds.  Psychiatric: He has a normal mood and affect. His behavior is normal.    Laboratory: Recent Labs  Lab 07/15/18 1204 07/16/18 0417  WBC 16.9* 15.2*  HGB 15.1 13.6  HCT 45.9 42.1  PLT 207 197   Recent Labs  Lab 07/15/18 1204 07/15/18 1519 07/16/18 0417  NA 136  --  137  K 3.5  --  3.1*  CL 98  --  101  CO2 27  --  27  BUN 10  --  10  CREATININE 1.38*  --  1.21  CALCIUM 9.4  --  8.6*  PROT  --  8.1  --   BILITOT  --  1.5*  --   ALKPHOS  --  58  --   ALT  --  28  --   AST  --  24  --   GLUCOSE 112*  --  108*    Lipid Panel     Component Value Date/Time   CHOL 116 07/16/2018 0417  TRIG 57 07/16/2018 0417   HDL 45 07/16/2018 0417   CHOLHDL 2.6 07/16/2018 0417   VLDL 11 07/16/2018 0417   LDLCALC 60 07/16/2018 0417   Blood Cx: NGTD at 24 Lactic Acid: 1.29  Imaging/Diagnostic Tests: Ct Renal Stone Study  Result Date: 07/15/2018 CLINICAL DATA:  Left flank pain with hematuria EXAM: CT ABDOMEN AND PELVIS WITHOUT CONTRAST TECHNIQUE: Multidetector CT imaging of the abdomen and pelvis was performed following the standard protocol without IV contrast. COMPARISON:  None. FINDINGS: Lower chest: Lung bases demonstrate no acute consolidation or  effusion. The heart size is normal. Hepatobiliary: Small hypodensities in the liver, likely cysts. No calcified stones or biliary dilatation Pancreas: Unremarkable. No pancreatic ductal dilatation or surrounding inflammatory changes. Spleen: Normal in size without focal abnormality. Adrenals/Urinary Tract: Adrenal glands are within normal limits. Nonspecific perinephric fat stranding. No hydronephrosis or ureteral stone. Vague polypoid density at the posterior bladder Stomach/Bowel: Stomach is within normal limits. Appendix appears normal. No evidence of bowel wall thickening, distention, or inflammatory changes. Vascular/Lymphatic: Mild aortic atherosclerosis. No aneurysm. No significantly enlarged lymph nodes. Reproductive: Slightly enlarged prostate Other: Negative for free air or free fluid. Fat in the umbilical region Musculoskeletal: Degenerative changes at L5-S1 IMPRESSION: 1. Negative for hydronephrosis or ureteral stone 2. Vague polypoid density at the posterior bladder, either representing a bladder mass/hematoma or mass effect from enlarged prostate gland. Consider correlation with direct visualization. Electronically Signed   By: Jasmine Pang M.D.   On: 07/15/2018 17:50    Dollene Cleveland, DO 07/16/2018, 9:05 AM PGY-1, [facility] FPTS Intern pager: [phone], text pages welcome

## 2018-07-16 NOTE — Progress Notes (Signed)
New Admission Note:  Arrival Method: Via stretcher from ED Mental Orientation: Alert & Oriented x4 Telemetry: N/A Assessment: Completed Skin: Refer to flowsheet IV: Left AC Pain: 5/10 Safety Measures: Safety Fall Prevention Plan discussed with patient. Admission: Completed [facility] [place] Orientation: Patient has been orientated to the room, unit and the staff. Family: Wife is at the bedside.   Orders have been reviewed and are being implemented. Will continue to monitor the patient. Call light has been placed within reach.   Aram CandelaJequetta Daiden Coltrane, RN  Phone Number: [phone]

## 2018-07-17 ENCOUNTER — Encounter: Payer: Self-pay | Admitting: Family Medicine

## 2018-07-17 ENCOUNTER — Encounter (HOSPITAL_COMMUNITY): Payer: Self-pay

## 2018-07-17 DIAGNOSIS — A4151 Sepsis due to Escherichia coli [E. coli]: Secondary | ICD-10-CM

## 2018-07-17 DIAGNOSIS — N400 Enlarged prostate without lower urinary tract symptoms: Secondary | ICD-10-CM

## 2018-07-17 LAB — URINE CULTURE: Culture: >=100000 — AB

## 2018-07-17 MED ORDER — POLYETHYLENE GLYCOL 3350 17 G PO PACK: 17.0000 g | PACK | Freq: Every day | ORAL | 0 refills | Status: AC | PRN

## 2018-07-17 MED ORDER — CEPHALEXIN 500 MG PO CAPS: 500.0000 mg | ORAL_CAPSULE | Freq: Four times a day (QID) | ORAL | 0 refills | Status: AC

## 2018-07-17 MED ORDER — TAMSULOSIN HCL 0.4 MG PO CAPS: 0.4000 mg | ORAL_CAPSULE | Freq: Every day | ORAL | 0 refills | Status: AC

## 2018-07-17 NOTE — Progress Notes (Signed)
Family Medicine Teaching Service Daily Progress Note Intern Pager: [phone]  Patient name: Daniel Perez Medical record number: [id number] Date of birth: 1955-12-05 Age: 62 y.o. Gender: male  Primary Care Provider: Gwenyth Benderean, Eric L, MD Consultants: none Code Status: full  Chief Complaint: hematuria  Assessment and Plan: Daniel Perez is a 62 y.o. male presenting with hematuria . PMH is significant for HTN, HLD, obesity  Urosepsis with BPH, improving : patient afebrile, clinically improving - continue IV ceftriaxone (9/12-). - urine culture pending, will switch to PO antibiotics once this results likely this afternoon then d/c home - Follow-up on blood cultures  Elevated PSA- PSA 10.5 - will need urologyfollow up outpatient  Hypertension, stable: BP 124/87 on 9/13. -Continue to hold home triamterene-HCTZ based on SCr -Monitor vitals  Hyperlipidemia, stable: chronic -Continue home atorvastatin  Prostate issues. Likely BPH as patient is on alpha blocker "for prevention of prostate issues" by PCP per patient report - changed doxazosin to Tamsulosin due to enlarged prostate on exam  FEN/GI: Heart healthy diet Prophylaxis: Lovenox  Disposition: home pending transition to oral abx  Subjective:  Patient reports he feels much improved this morning however he did notice some more blood in his urine this morning. He denies any pain. No fevers or chills. He is hopeful to go home.   Objective: Temp:  [98.4 F (36.9 C)-99.9 F (37.7 C)] 98.4 F (36.9 C) (09/14 0600) Pulse Rate:  [68-72] 68 (09/14 0600) Resp:  [16-18] 16 (09/14 0600) BP: (97-131)/(72-87) 97/72 (09/14 0600) SpO2:  [99 %-100 %] 99 % (09/14 0600)  Gen: pleasant AA male in NAD, ambulating in room Heart: RRR no MRG Lungs: CTA bilaterally, no increased work of breathing Abdomen: soft, non-tender, non-distended, +BS Extremities: no edema or cyanosis, +2 dorsalis pedis pulse bilaterally Neuro: no focal  deficits  Laboratory: Recent Labs  Lab 07/15/18 1204 07/16/18 0417  WBC 16.9* 15.2*  HGB 15.1 13.6  HCT 45.9 42.1  PLT 207 197   Recent Labs  Lab 07/15/18 1204 07/15/18 1519 07/16/18 0417  NA 136  --  137  K 3.5  --  3.1*  CL 98  --  101  CO2 27  --  27  BUN 10  --  10  CREATININE 1.38*  --  1.21  CALCIUM 9.4  --  8.6*  PROT  --  8.1  --   BILITOT  --  1.5*  --   ALKPHOS  --  58  --   ALT  --  28  --   AST  --  24  --   GLUCOSE 112*  --  108*    Lipid Panel     Component Value Date/Time   CHOL 116 07/16/2018 0417   TRIG 57 07/16/2018 0417   HDL 45 07/16/2018 0417   CHOLHDL 2.6 07/16/2018 0417   VLDL 11 07/16/2018 0417   LDLCALC 60 07/16/2018 0417   Blood Cx: NGTD at 24 Lactic Acid: 1.29  Imaging/Diagnostic Tests: Ct Renal Stone Study  Result Date: 07/15/2018 CLINICAL DATA:  Left flank pain with hematuria EXAM: CT ABDOMEN AND PELVIS WITHOUT CONTRAST TECHNIQUE: Multidetector CT imaging of the abdomen and pelvis was performed following the standard protocol without IV contrast. COMPARISON:  None. FINDINGS: Lower chest: Lung bases demonstrate no acute consolidation or effusion. The heart size is normal. Hepatobiliary: Small hypodensities in the liver, likely cysts. No calcified stones or biliary dilatation Pancreas: Unremarkable. No pancreatic ductal dilatation or surrounding inflammatory changes. Spleen: Normal in size without  focal abnormality. Adrenals/Urinary Tract: Adrenal glands are within normal limits. Nonspecific perinephric fat stranding. No hydronephrosis or ureteral stone. Vague polypoid density at the posterior bladder Stomach/Bowel: Stomach is within normal limits. Appendix appears normal. No evidence of bowel wall thickening, distention, or inflammatory changes. Vascular/Lymphatic: Mild aortic atherosclerosis. No aneurysm. No significantly enlarged lymph nodes. Reproductive: Slightly enlarged prostate Other: Negative for free air or free fluid. Fat in the  umbilical region Musculoskeletal: Degenerative changes at L5-S1 IMPRESSION: 1. Negative for hydronephrosis or ureteral stone 2. Vague polypoid density at the posterior bladder, either representing a bladder mass/hematoma or mass effect from enlarged prostate gland. Consider correlation with direct visualization. Electronically Signed   By: Jasmine Pang M.D.   On: 07/15/2018 17:50    Tillman Sers, DO 07/17/2018, 8:05 AM PGY-3, [facility] FPTS Intern pager: [phone], text pages welcome

## 2018-07-17 NOTE — Discharge Instructions (Signed)
Please follow up with primary care physician and a urologist. A referral was placed for urology.   Please stop your cardura and take Flomax daily instead. Please continue antibiotics Kelfex four times per day for 11 more days starting on 9/15.

## 2018-07-17 NOTE — Progress Notes (Signed)
Patient called requesting return to work note include "Light Duty".  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  Patient made aware.  Bernie CoveyKimberly Devery Murgia RN-BC, [employer]

## 2018-07-17 NOTE — Discharge Summary (Signed)
Family Medicine Teaching [facility] Discharge Summary  Patient name: Daniel Perez Medical record number: [id number] Date of birth: 08/29/1956 Age: 62 y.o. Gender: male Date of Admission: 07/15/2018  Date of Discharge: 07/16/2018 Admitting Physician: Westley Chandlerarina M Brown, MD  Primary Care Provider: Gwenyth Benderean, Eric L, MD Consultants: None  Indication for Hospitalization: Hematuria  Discharge Diagnoses/Problem List:  Benign Prostatic Hyperplasia Elevated PSA Hypertension Hyperlipidemia  Disposition: Discharge home  Discharge Condition: Stable  Discharge Exam:  Gen: pleasant AA male in NAD, ambulating in room Heart: RRR no MRG Lungs: CTA bilaterally, no increased work of breathing Abdomen: soft, non-tender, non-distended, +BS Extremities: no edema or cyanosis, +2 dorsalis pedis pulse bilaterally Neuro: no focal deficits  Brief Hospital Course:  Daniel Perez was admitted on 07/15/2018 for hematuria. CT scan showed enlarged prostate. Urine Culture grew pan-sensitive E. Coli UTI for which he was started on IV Ceftriaxone. The patient was found to have a boggy, enlarged prostate on rectal exam. PSA was elevated at 10.57 and the patient's Cardura was switched to Flomax for improvement of urinary retention. The patient's condition improved and he was discharged home on PO Keflex with close outpatient follow up.   Issues for Follow Up:  1. Patient sent home with 11 more days of keflex 500mg  q6 to complete 14 day course. 2. Patient will need follow up with urology regarding CT findings.  3. Stopped Cardura and started Flomax.   Significant Procedures: None  Significant Labs and Imaging:  Recent Labs  Lab 07/16/18 0417  WBC 15.2*  HGB 13.6  HCT 42.1  PLT 197   Recent Labs  Lab 07/15/18 1519 07/16/18 0417  NA  --  137  K  --  3.1*  CL  --  101  CO2  --  27  GLUCOSE  --  108*  BUN  --  10  CREATININE  --  1.21  CALCIUM  --  8.6*  ALKPHOS 58  --   AST 24  --   ALT 28  --    ALBUMIN 3.8  --    Lipid Panel     Component Value Date/Time   CHOL 116 07/16/2018 0417   TRIG 57 07/16/2018 0417   HDL 45 07/16/2018 0417   CHOLHDL 2.6 07/16/2018 0417   VLDL 11 07/16/2018 0417   LDLCALC 60 07/16/2018 0417   PSA 10.57 Ucx with pan-sensitive E. Coli PVR 60cc Blood Cx 9/12: No growth  Results/Tests Pending at Time of Discharge: None  Discharge Medications:  Allergies as of 07/17/2018   No Known Allergies     Medication List    STOP taking these medications   doxazosin 4 MG tablet Commonly known as:  CARDURA   HYDROcodone-acetaminophen 5-325 MG tablet Commonly known as:  NORCO/VICODIN   ibuprofen 200 MG tablet Commonly known as:  ADVIL,MOTRIN   ibuprofen 600 MG tablet Commonly known as:  ADVIL,MOTRIN   tadalafil 20 MG tablet Commonly known as:  ADCIRCA/CIALIS     TAKE these medications   atorvastatin 20 MG tablet Commonly known as:  LIPITOR Take 20 mg by mouth daily.   cephALEXin 500 MG capsule Commonly known as:  KEFLEX Take 1 capsule (500 mg total) by mouth 4 (four) times daily for 11 days.   omega-3 acid ethyl esters 1 g capsule Commonly known as:  LOVAZA Take 1 g by mouth daily.   polyethylene glycol packet Commonly known as:  MIRALAX / GLYCOLAX Take 17 g by mouth daily as needed for mild constipation.  tamsulosin 0.4 MG Caps capsule Commonly known as:  FLOMAX Take 1 capsule (0.4 mg total) by mouth daily after breakfast.   triamterene-hydrochlorothiazide 37.5-25 MG tablet Commonly known as:  MAXZIDE-25 Take 1 tablet by mouth daily.      Discharge Instructions: Please refer to Patient Instructions section of EMR for full details.  Patient was counseled important signs and symptoms that should prompt return to medical care, changes in medications, dietary instructions, activity restrictions, and follow up appointments.   Follow-Up Appointments:  Peggyann Shoals, DO Family Medicine PGY-1 [facility] Family Medicine

## 2018-07-20 LAB — CULTURE, BLOOD (ROUTINE X 2)
CULTURE: NO GROWTH
Culture: NO GROWTH
SPECIAL REQUESTS: ADEQUATE
Special Requests: ADEQUATE

## 2018-08-15 ENCOUNTER — Other Ambulatory Visit: Payer: Self-pay | Admitting: Family Medicine

## 2019-08-14 ENCOUNTER — Emergency Department (HOSPITAL_COMMUNITY)
Admission: EM | Admit: 2019-08-14 | Discharge: 2019-08-14 | Disposition: A | Discharge status: Home / Self Care | Payer: No Typology Code available for payment source | Attending: Emergency Medicine | Admitting: Emergency Medicine

## 2019-08-14 ENCOUNTER — Other Ambulatory Visit: Payer: Self-pay

## 2019-08-14 ENCOUNTER — Encounter (HOSPITAL_COMMUNITY): Payer: Self-pay | Admitting: *Deleted

## 2019-08-14 DIAGNOSIS — R3915 Urgency of urination: Secondary | ICD-10-CM | POA: Diagnosis not present

## 2019-08-14 DIAGNOSIS — Z87891 Personal history of nicotine dependence: Secondary | ICD-10-CM | POA: Insufficient documentation

## 2019-08-14 DIAGNOSIS — R300 Dysuria: Secondary | ICD-10-CM | POA: Insufficient documentation

## 2019-08-14 DIAGNOSIS — Z79899 Other long term (current) drug therapy: Secondary | ICD-10-CM | POA: Insufficient documentation

## 2019-08-14 DIAGNOSIS — I10 Essential (primary) hypertension: Secondary | ICD-10-CM | POA: Insufficient documentation

## 2019-08-14 DIAGNOSIS — R319 Hematuria, unspecified: Secondary | ICD-10-CM | POA: Diagnosis present

## 2019-08-14 DIAGNOSIS — N390 Urinary tract infection, site not specified: Secondary | ICD-10-CM | POA: Insufficient documentation

## 2019-08-14 LAB — CBC WITH DIFFERENTIAL/PLATELET
Abs Immature Granulocytes: 0.04 10*3/uL (ref 0.00–0.07)
Basophils Absolute: 0 10*3/uL (ref 0.0–0.1)
Basophils Relative: 0 %
Eosinophils Absolute: 0 10*3/uL (ref 0.0–0.5)
Eosinophils Relative: 0 %
HCT: 45.1 % (ref 39.0–52.0)
Hemoglobin: 15.4 g/dL (ref 13.0–17.0)
Immature Granulocytes: 0 %
Lymphocytes Relative: 13 %
Lymphs Abs: 1.4 10*3/uL (ref 0.7–4.0)
MCH: 29.2 pg (ref 26.0–34.0)
MCHC: 34.1 g/dL (ref 30.0–36.0)
MCV: 85.6 fL (ref 80.0–100.0)
Monocytes Absolute: 0.7 10*3/uL (ref 0.1–1.0)
Monocytes Relative: 6 %
Neutro Abs: 8.4 10*3/uL — ABNORMAL HIGH (ref 1.7–7.7)
Neutrophils Relative %: 81 %
Platelets: 329 10*3/uL (ref 150–400)
RBC: 5.27 MIL/uL (ref 4.22–5.81)
RDW: 14.4 % (ref 11.5–15.5)
WBC: 10.6 10*3/uL — ABNORMAL HIGH (ref 4.0–10.5)
nRBC: 0 % (ref 0.0–0.2)

## 2019-08-14 LAB — URINALYSIS, ROUTINE W REFLEX MICROSCOPIC
Bacteria, UA: NONE SEEN
Bilirubin Urine: NEGATIVE
Glucose, UA: NEGATIVE mg/dL
Ketones, ur: NEGATIVE mg/dL
Nitrite: NEGATIVE
Protein, ur: 100 mg/dL — AB
RBC / HPF: >50 RBC/hpf — ABNORMAL HIGH (ref 0–5)
Specific Gravity, Urine: 1.02 (ref 1.005–1.030)
WBC, UA: >50 WBC/hpf — ABNORMAL HIGH (ref 0–5)
pH: 6 (ref 5.0–8.0)

## 2019-08-14 LAB — BASIC METABOLIC PANEL
Anion gap: 10 (ref 5–15)
BUN: 9 mg/dL (ref 8–23)
CO2: 23 mmol/L (ref 22–32)
Calcium: 9 mg/dL (ref 8.9–10.3)
Chloride: 101 mmol/L (ref 98–111)
Creatinine, Ser: 0.98 mg/dL (ref 0.61–1.24)
GFR calc Af Amer: >60 mL/min (ref >60)
GFR calc non Af Amer: >60 mL/min (ref >60)
Glucose, Bld: 107 mg/dL — ABNORMAL HIGH (ref 70–99)
Potassium: 4.4 mmol/L (ref 3.5–5.1)
Sodium: 134 mmol/L — ABNORMAL LOW (ref 135–145)

## 2019-08-14 MED ORDER — CEPHALEXIN 500 MG PO CAPS: 500.0000 mg | ORAL_CAPSULE | Freq: Three times a day (TID) | ORAL | 0 refills | Status: AC

## 2019-08-14 MED ORDER — CEPHALEXIN 250 MG PO CAPS
500.0000 mg | ORAL_CAPSULE | Freq: Once | ORAL | Status: AC
  Administered 2019-08-14: 500 mg via ORAL
  Filled 2019-08-14: qty 2

## 2019-08-14 NOTE — ED Triage Notes (Signed)
Pt reports hematuria onset this morning with hx of the same. Denies pain at present, has increased discomfort when urinating

## 2019-08-14 NOTE — ED Notes (Signed)
Urine culture sent to lab with urine sample.  

## 2019-08-14 NOTE — ED Notes (Signed)
Pt verbalized understanding of d/c instructions, prescriptions and s/s that would require return to ED

## 2019-08-14 NOTE — ED Provider Notes (Signed)
[facility] Provider Note   CSN: [id number] Arrival date & time: 08/14/19  0346    History   Chief Complaint Chief Complaint  Patient presents with  . Hematuria    HPI Daniel Perez is a 63 y.o. male.   The history is provided by the patient.  Hematuria  He has history of hypertension, hyperlipidemia, prostatic enlargement and comes in because of blood in his urine tonight.  He endorses slight dysuria and slight urinary urgency but no frequency or tenesmus.  He denies abdominal pain or flank pain.  He denies fever, chills, sweats.  Past Medical History:  Diagnosis Date  . Arthritis    "knees" (07/15/2018)  . Erectile dysfunction   . Hematuria 07/15/2018  . Hyperlipidemia   . Hypertension   . Mild obesity   . Periodontitis   . Situational stress    transient  . Vitamin D deficiency     Patient Active Problem List   Diagnosis Date Noted  . Prostate enlargement 07/17/2018  . Sepsis ([place]) 07/15/2018  . Acute cystitis with hematuria   . Hypertension   . Hyperlipidemia   . Mild obesity   . Erectile dysfunction   . Situational stress   . Vitamin D deficiency     Past Surgical History:  Procedure Laterality Date  . TONSILLECTOMY     1992        Home Medications    Prior to Admission medications   Medication Sig Start Date End Date Taking? Authorizing Provider  atorvastatin (LIPITOR) 20 MG tablet Take 20 mg by mouth daily.    [provider]  omega-3 acid ethyl esters (LOVAZA) 1 g capsule Take 1 g by mouth daily.    [provider]  polyethylene glycol (MIRALAX / GLYCOLAX) packet Take 17 g by mouth daily as needed for mild constipation. 07/17/18   Shirley, [place], DO  tamsulosin (FLOMAX) 0.4 MG CAPS capsule Take 1 capsule (0.4 mg total) by mouth daily after breakfast. 07/18/18   Shirley, [place], DO  triamterene-hydrochlorothiazide (MAXZIDE-25) 37.5-25 MG tablet Take 1 tablet by mouth daily.    [provider]    Family History Family History  Problem Relation Age of Onset  . Stroke Mother   . Lung cancer Father   . Cirrhosis Sister   . Pneumonia Sister     Social History Social History   Tobacco Use  . Smoking status: Former Smoker    Packs/day: 0.33    Years: 2.00    Pack years: 0.66    Types: Cigarettes  . Smokeless tobacco: Never Used  . Tobacco comment: 07/15/2018 "stopped in the late 1970s"  Substance Use Topics  . Alcohol use: Yes    Comment: 07/15/2018 "1 drink q 2-3 weeks"  . Drug use: Not Currently     Allergies   Patient has no known allergies.   Review of Systems Review of Systems  Genitourinary: Positive for hematuria.  All other systems reviewed and are negative.    Physical Exam Updated Vital Signs BP (!) 133/105 (BP Location: Right Arm)   Pulse 96   Temp 99.4 F (37.4 C) (Oral)   Resp 18   SpO2 94%   Physical Exam Vitals signs and nursing note reviewed.    63 year old male, resting comfortably and in no acute distress. Vital signs are significant for elevated blood pressure. Oxygen saturation is 94%, which is normal. Head is normocephalic and atraumatic. PERRLA, EOMI. Oropharynx is clear. Neck  is nontender and supple without adenopathy or JVD. Back is nontender and there is no CVA tenderness. Lungs are clear without rales, wheezes, or rhonchi. Chest is nontender. Heart has regular rate and rhythm without murmur. Abdomen is soft, flat, nontender without masses or hepatosplenomegaly and peristalsis is normoactive. Extremities have no cyanosis or edema, full range of motion is present. Skin is warm and dry without rash. Neurologic: Mental status is normal, cranial nerves are intact, there are no motor or sensory deficits.  ED Treatments / Results  Labs (all labs ordered are listed, but only abnormal results are displayed) Labs Reviewed  URINALYSIS, ROUTINE W REFLEX MICROSCOPIC - Abnormal; Notable for the following components:       Result Value   APPearance CLOUDY (*)    Hgb urine dipstick LARGE (*)    Protein, ur 100 (*)    Leukocytes,Ua LARGE (*)    RBC / HPF >50 (*)    WBC, UA >50 (*)    All other components within normal limits  BASIC METABOLIC PANEL - Abnormal; Notable for the following components:   Sodium 134 (*)    Glucose, Bld 107 (*)    All other components within normal limits  CBC WITH DIFFERENTIAL/PLATELET - Abnormal; Notable for the following components:   WBC 10.6 (*)    Neutro Abs 8.4 (*)    All other components within normal limits  URINE CULTURE    Procedures Procedures   Medications Ordered in ED Medications  cephALEXin (KEFLEX) capsule 500 mg (has no administration in time range)     Initial Impression / Assessment and Plan / ED Course  I have reviewed the triage vital signs and the nursing notes.  Pertinent labs & imaging results that were available during my care of the patient were reviewed by me and considered in my medical decision making (see chart for details).  Hematuria.  Urinalysis appears to show evidence of UTI with greater than 50 RBCs, greater than 50 WBCs with WBC clumps noted.  Old records are reviewed, and he had been admitted to the hospital 1 year ago with urosepsis.  Urine is sent for culture and he is given a dose of cephalexin.  Screening labs are obtained.    Final Clinical Impressions(s) / ED Diagnoses   Final diagnoses:  Urinary tract infection with hematuria, site unspecified    ED Discharge Orders         Ordered    cephALEXin (KEFLEX) 500 MG capsule  3 times daily     08/14/19 3151           Dione Booze, MD 08/14/19 [phone]

## 2019-08-14 NOTE — Discharge Instructions (Signed)
Return if you start running a high fever.

## 2019-08-15 LAB — URINE CULTURE: Culture: NO GROWTH

## 2019-10-02 IMAGING — CT CT RENAL STONE PROTOCOL
2 of 4 series · 17 of 46 positions shown, 19 images · non-contrast
Comparison: None.

CLINICAL DATA: Left flank pain with hematuria

EXAM:
CT ABDOMEN AND PELVIS WITHOUT CONTRAST
TECHNIQUE: Multidetector CT imaging of the abdomen and pelvis was performed
following the standard protocol without IV contrast.

[Series 3: stone study 5.0 i30f 2 · axial · 0.89mm/px · z∈[+1023,+1478]mm · 14 of 101 slices shown, 16 images]
[im 5/101  soft-tissue]
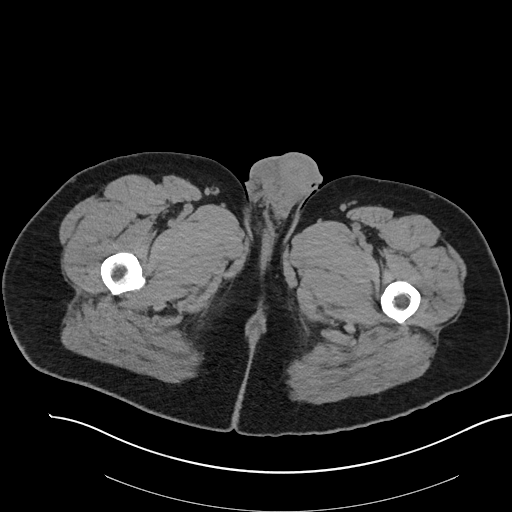
[im 5/101  bone]
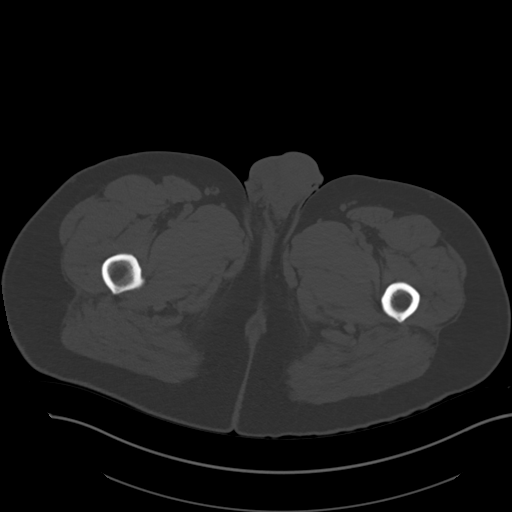
[im 13/101  soft-tissue]
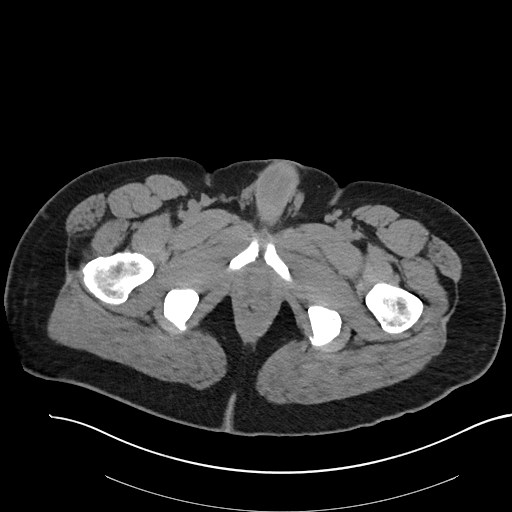
[im 21/101  soft-tissue]
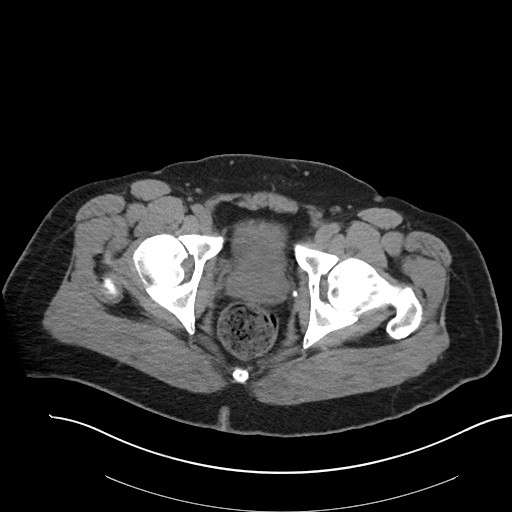
[im 26/101  soft-tissue]
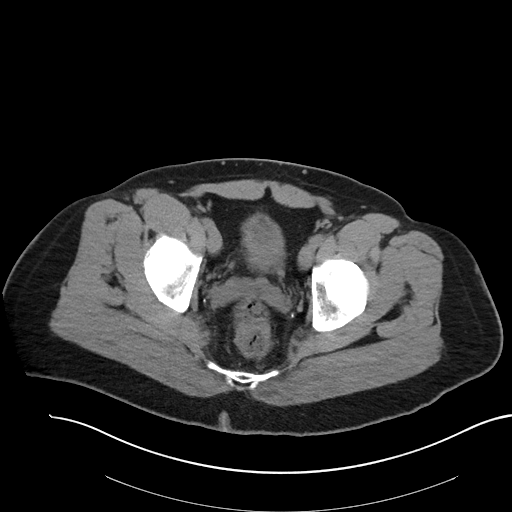
[im 34/101  soft-tissue]
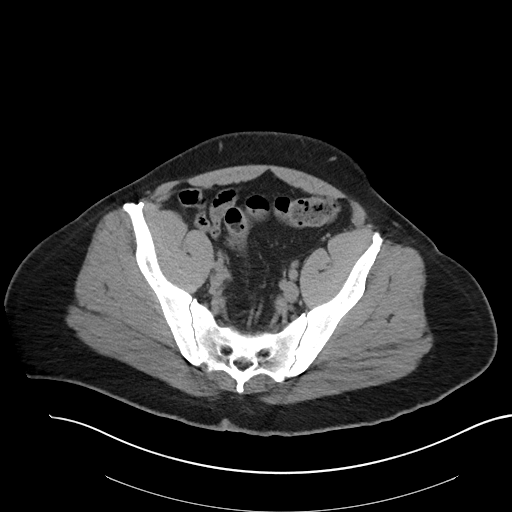
[im 42/101  soft-tissue]
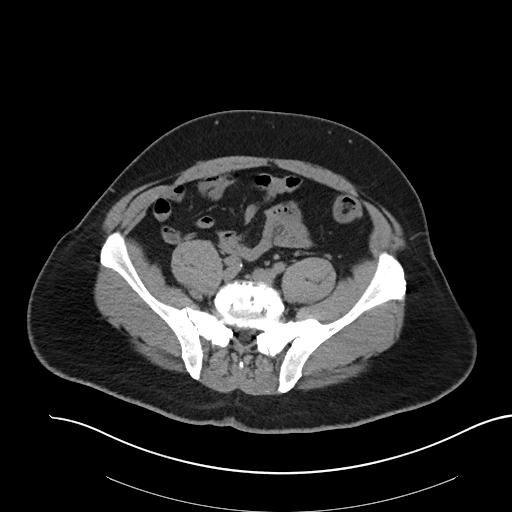
[im 46/101  soft-tissue]
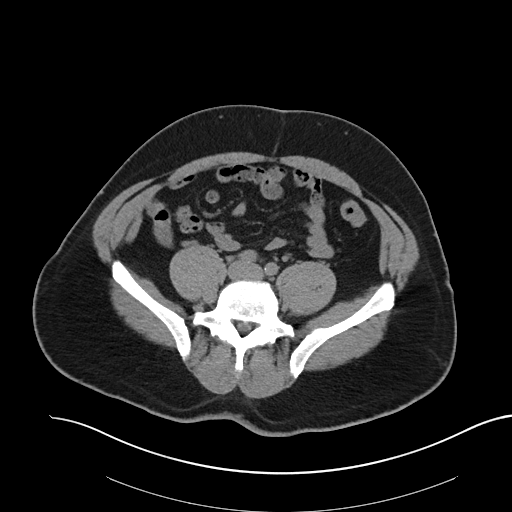
[im 55/101  soft-tissue]
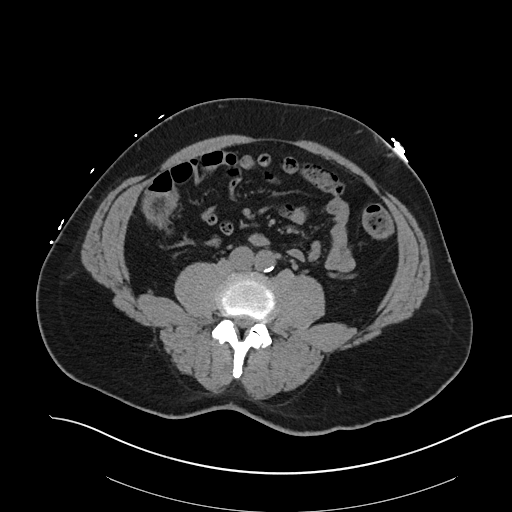
[im 59/101  soft-tissue]
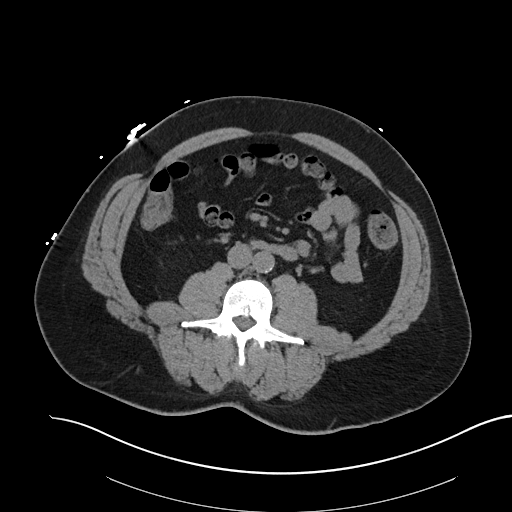
[im 59/101  bone]
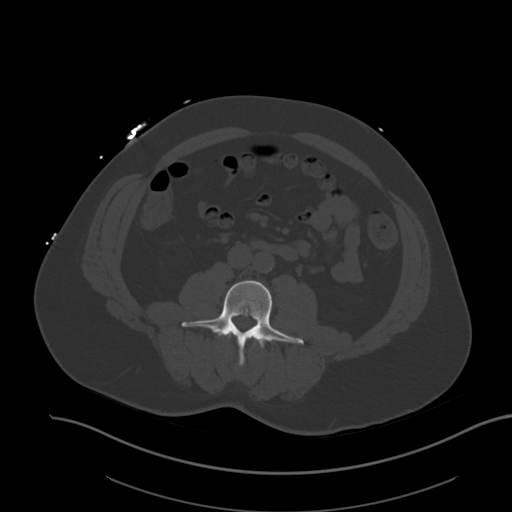
[im 67/101  soft-tissue]
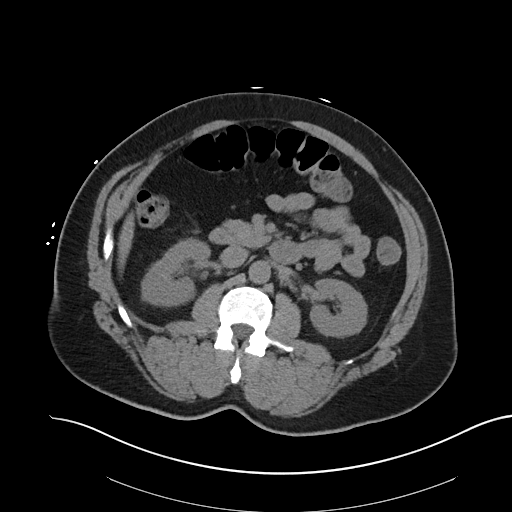
[im 76/101  soft-tissue]
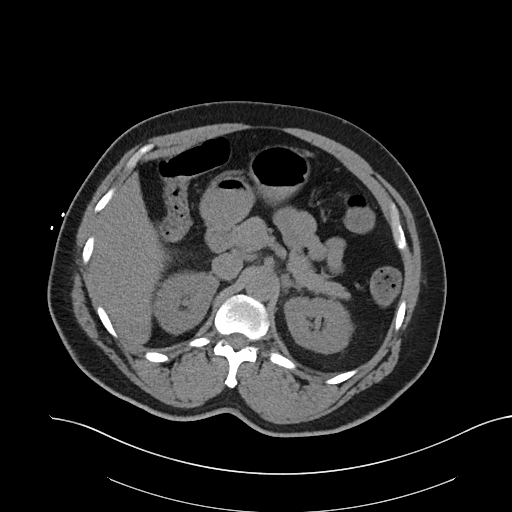
[im 80/101  soft-tissue]
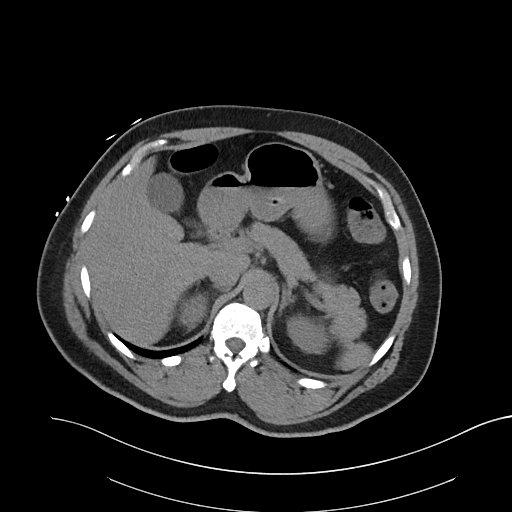
[im 88/101  soft-tissue]
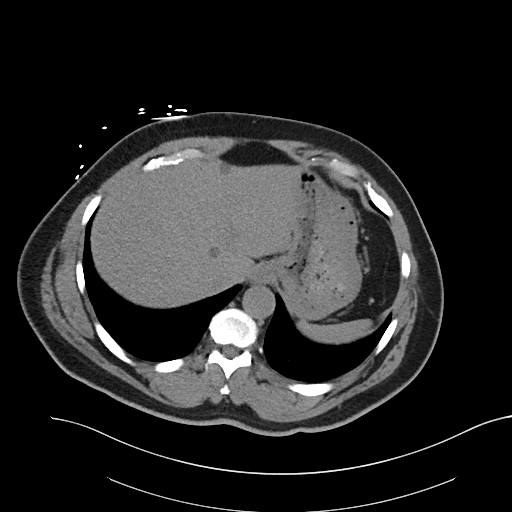
[im 96/101  soft-tissue]
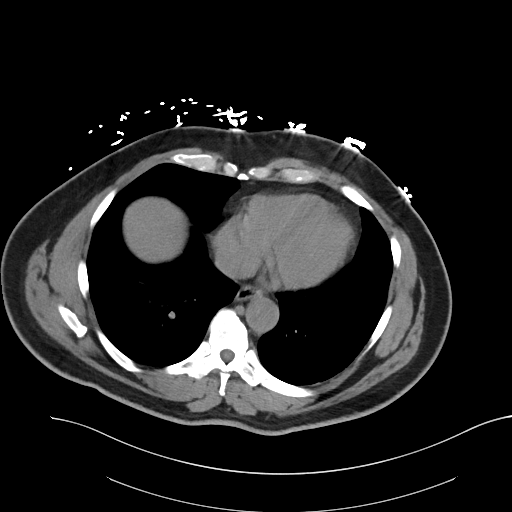

[Series 6: coronal soft tissue · coronal · 0.87mm/px · 3 of 105 slices shown]
[im 35/105  soft-tissue]
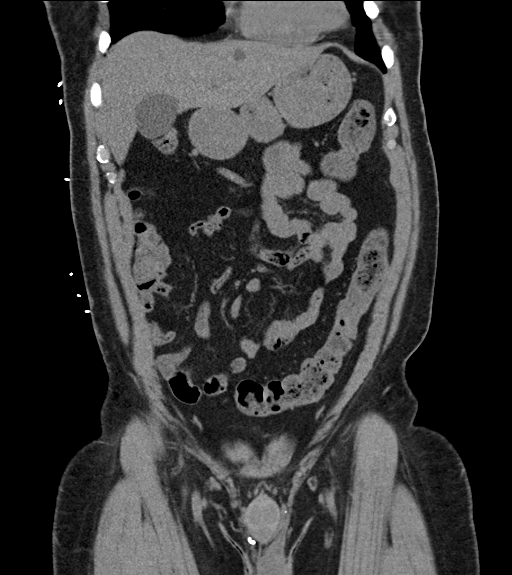
[im 47/105  soft-tissue]
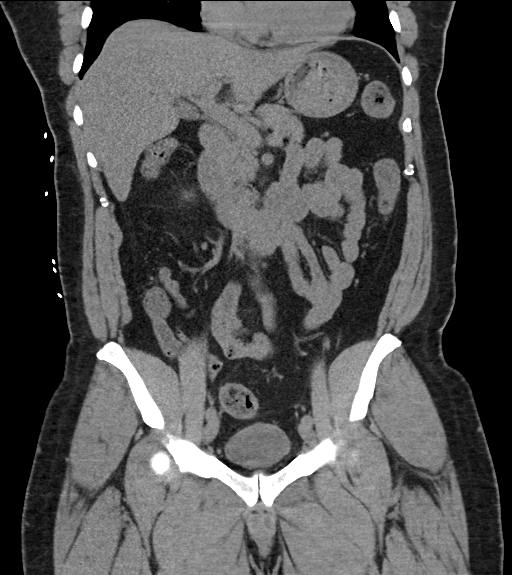
[im 58/105  soft-tissue]
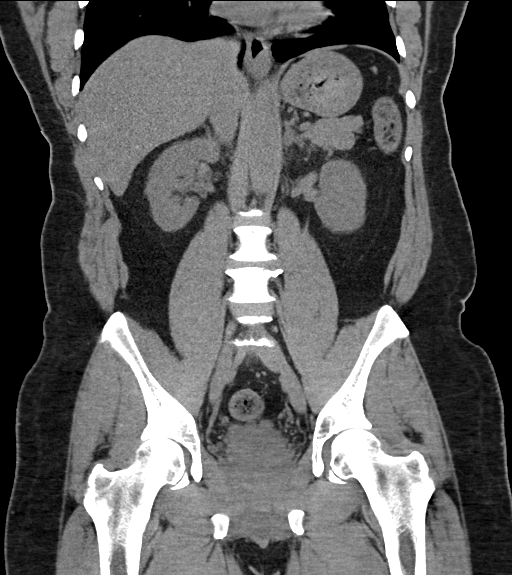

[17 of 46 positions shown; findings below may reference images not displayed]

FINDINGS: Lower chest: Lung bases demonstrate no acute consolidation or
effusion. The heart size is normal.

Hepatobiliary: Small hypodensities in the liver, likely cysts. No
calcified stones or biliary dilatation

Pancreas: Unremarkable. No pancreatic ductal dilatation or
surrounding inflammatory changes.

Spleen: Normal in size without focal abnormality.

Adrenals/Urinary Tract: Adrenal glands are within normal limits.
Nonspecific perinephric fat stranding. No hydronephrosis or ureteral
stone. Vague polypoid density at the posterior bladder

Stomach/Bowel: Stomach is within normal limits. Appendix appears
normal. No evidence of bowel wall thickening, distention, or
inflammatory changes.

Vascular/Lymphatic: Mild aortic atherosclerosis. No aneurysm. No
significantly enlarged lymph nodes.

Reproductive: Slightly enlarged prostate

Other: Negative for free air or free fluid. Fat in the umbilical
region

Musculoskeletal: Degenerative changes at L5-S1
IMPRESSION: 1. Negative for hydronephrosis or ureteral stone
2. Vague polypoid density at the posterior bladder, either
representing a bladder mass/hematoma or mass effect from enlarged
prostate gland. Consider correlation with direct visualization.

## 2020-10-06 ENCOUNTER — Ambulatory Visit: Payer: No Typology Code available for payment source | Attending: Internal Medicine

## 2020-10-06 DIAGNOSIS — Z23 Encounter for immunization: Secondary | ICD-10-CM

## 2020-10-06 NOTE — Progress Notes (Signed)
   Covid-19 Vaccination Clinic  Name:  Daniel Perez    MRN: [id number] DOB: April 11, 1956  10/06/2020  [name] was observed post Covid-19 immunization for 15 minutes without incident. He was provided with Vaccine Information Sheet and instruction to access the V-Safe system.   [name] was instructed to call 911 with any severe reactions post vaccine: Marland Kitchen Difficulty breathing  . Swelling of face and throat  . A fast heartbeat  . A bad rash all over body  . Dizziness and weakness   Immunizations Administered    Name Date Dose VIS Date Route   Pfizer COVID-19 Vaccine 10/06/2020  9:25 AM 0.3 mL 08/22/2020 Intramuscular   Manufacturer: [employer], [employer]   Lot: [id number]   NDC: [address]-9

## 2024-06-27 ENCOUNTER — Other Ambulatory Visit: Payer: Self-pay

## 2024-06-27 ENCOUNTER — Encounter (HOSPITAL_BASED_OUTPATIENT_CLINIC_OR_DEPARTMENT_OTHER): Payer: Self-pay | Admitting: Emergency Medicine

## 2024-06-27 ENCOUNTER — Emergency Department (HOSPITAL_BASED_OUTPATIENT_CLINIC_OR_DEPARTMENT_OTHER): Payer: Worker's Compensation

## 2024-06-27 ENCOUNTER — Emergency Department (HOSPITAL_BASED_OUTPATIENT_CLINIC_OR_DEPARTMENT_OTHER)
Admission: EM | Admit: 2024-06-27 | Discharge: 2024-06-27 | Disposition: A | Discharge status: Home / Self Care | Payer: Worker's Compensation

## 2024-06-27 DIAGNOSIS — Y92410 Unspecified street and highway as the place of occurrence of the external cause: Secondary | ICD-10-CM | POA: Diagnosis not present

## 2024-06-27 DIAGNOSIS — M542 Cervicalgia: Secondary | ICD-10-CM | POA: Insufficient documentation

## 2024-06-27 DIAGNOSIS — M545 Low back pain, unspecified: Secondary | ICD-10-CM | POA: Insufficient documentation

## 2024-06-27 DIAGNOSIS — I10 Essential (primary) hypertension: Secondary | ICD-10-CM | POA: Diagnosis not present

## 2024-06-27 MED ORDER — NAPROXEN 500 MG PO TABS: 500.0000 mg | ORAL_TABLET | Freq: Two times a day (BID) | ORAL | 0 refills | Status: AC

## 2024-06-27 MED ORDER — METHOCARBAMOL 500 MG PO TABS
1000.0000 mg | ORAL_TABLET | Freq: Three times a day (TID) | ORAL | 0 refills | Status: AC | PRN
Start: 2024-06-27 — End: ?
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
## 2025-02-08 ENCOUNTER — Ambulatory Visit: Admitting: Family Medicine
